# Patient Record
Sex: Male | Born: 1970 | Race: White | Hispanic: No | Marital: Single | Smoking: Current every day smoker
Health system: Southern US, Community
[De-identification: ages and names within clinical notes are randomized; demographics above are authoritative.]

## PROBLEM LIST (undated history)

## (undated) HISTORY — PX: EYE SURGERY: SHX253

## (undated) HISTORY — PX: TONSILLECTOMY: SUR1361

## (undated) HISTORY — PX: APPENDECTOMY: SHX54

---

## 2017-10-20 ENCOUNTER — Encounter (HOSPITAL_COMMUNITY): Payer: Self-pay | Admitting: Radiology

## 2017-10-20 ENCOUNTER — Emergency Department (HOSPITAL_COMMUNITY): Payer: Self-pay

## 2017-10-20 ENCOUNTER — Inpatient Hospital Stay (HOSPITAL_COMMUNITY)
Admission: EM | Admit: 2017-10-20 | Discharge: 2017-10-28 | DRG: 492 | Disposition: A | Discharge status: Home / Self Care | Payer: Self-pay | Attending: Student | Admitting: Student

## 2017-10-20 DIAGNOSIS — R402362 Coma scale, best motor response, obeys commands, at arrival to emergency department: Secondary | ICD-10-CM | POA: Diagnosis present

## 2017-10-20 DIAGNOSIS — Z23 Encounter for immunization: Secondary | ICD-10-CM

## 2017-10-20 DIAGNOSIS — E871 Hypo-osmolality and hyponatremia: Secondary | ICD-10-CM | POA: Diagnosis present

## 2017-10-20 DIAGNOSIS — R402132 Coma scale, eyes open, to sound, at arrival to emergency department: Secondary | ICD-10-CM | POA: Diagnosis present

## 2017-10-20 DIAGNOSIS — S020XXA Fracture of vault of skull, initial encounter for closed fracture: Secondary | ICD-10-CM | POA: Diagnosis present

## 2017-10-20 DIAGNOSIS — S066X9A Traumatic subarachnoid hemorrhage with loss of consciousness of unspecified duration, initial encounter: Secondary | ICD-10-CM | POA: Diagnosis present

## 2017-10-20 DIAGNOSIS — Z880 Allergy status to penicillin: Secondary | ICD-10-CM

## 2017-10-20 DIAGNOSIS — S064X9A Epidural hemorrhage with loss of consciousness of unspecified duration, initial encounter: Secondary | ICD-10-CM | POA: Diagnosis present

## 2017-10-20 DIAGNOSIS — Y904 Blood alcohol level of 80-99 mg/100 ml: Secondary | ICD-10-CM | POA: Diagnosis present

## 2017-10-20 DIAGNOSIS — G4700 Insomnia, unspecified: Secondary | ICD-10-CM | POA: Diagnosis present

## 2017-10-20 DIAGNOSIS — Z419 Encounter for procedure for purposes other than remedying health state, unspecified: Secondary | ICD-10-CM

## 2017-10-20 DIAGNOSIS — T148XXA Other injury of unspecified body region, initial encounter: Secondary | ICD-10-CM

## 2017-10-20 DIAGNOSIS — S064XAA Epidural hemorrhage with loss of consciousness status unknown, initial encounter: Secondary | ICD-10-CM | POA: Diagnosis present

## 2017-10-20 DIAGNOSIS — R402242 Coma scale, best verbal response, confused conversation, at arrival to emergency department: Secondary | ICD-10-CM | POA: Diagnosis present

## 2017-10-20 DIAGNOSIS — F10129 Alcohol abuse with intoxication, unspecified: Secondary | ICD-10-CM | POA: Diagnosis present

## 2017-10-20 DIAGNOSIS — S0232XA Fracture of orbital floor, left side, initial encounter for closed fracture: Secondary | ICD-10-CM | POA: Diagnosis present

## 2017-10-20 DIAGNOSIS — Y9355 Activity, bike riding: Secondary | ICD-10-CM

## 2017-10-20 DIAGNOSIS — F19239 Other psychoactive substance dependence with withdrawal, unspecified: Secondary | ICD-10-CM | POA: Diagnosis not present

## 2017-10-20 DIAGNOSIS — F17200 Nicotine dependence, unspecified, uncomplicated: Secondary | ICD-10-CM | POA: Diagnosis present

## 2017-10-20 DIAGNOSIS — S82841A Displaced bimalleolar fracture of right lower leg, initial encounter for closed fracture: Principal | ICD-10-CM | POA: Diagnosis present

## 2017-10-20 LAB — I-STAT CHEM 8, ED
BUN: 13 mg/dL (ref 6–20)
CHLORIDE: 101 mmol/L (ref 101–111)
Calcium, Ion: 1.16 mmol/L (ref 1.15–1.40)
Creatinine, Ser: 1.3 mg/dL — ABNORMAL HIGH (ref 0.61–1.24)
Glucose, Bld: 112 mg/dL — ABNORMAL HIGH (ref 65–99)
HEMATOCRIT: 41 % (ref 39.0–52.0)
HEMOGLOBIN: 13.9 g/dL (ref 13.0–17.0)
POTASSIUM: 3.9 mmol/L (ref 3.5–5.1)
SODIUM: 141 mmol/L (ref 135–145)
TCO2: 28 mmol/L (ref 22–32)

## 2017-10-20 LAB — COMPREHENSIVE METABOLIC PANEL
ALK PHOS: 47 U/L (ref 38–126)
ALT: 21 U/L (ref 17–63)
ANION GAP: 8 (ref 5–15)
AST: 23 U/L (ref 15–41)
Albumin: 3.9 g/dL (ref 3.5–5.0)
BILIRUBIN TOTAL: 0.3 mg/dL (ref 0.3–1.2)
BUN: 13 mg/dL (ref 6–20)
CALCIUM: 9 mg/dL (ref 8.9–10.3)
CO2: 26 mmol/L (ref 22–32)
Chloride: 105 mmol/L (ref 101–111)
Creatinine, Ser: 1.15 mg/dL (ref 0.61–1.24)
GFR calc Af Amer: >60 mL/min (ref >60)
Glucose, Bld: 114 mg/dL — ABNORMAL HIGH (ref 65–99)
Potassium: 3.9 mmol/L (ref 3.5–5.1)
Sodium: 139 mmol/L (ref 135–145)
TOTAL PROTEIN: 6.6 g/dL (ref 6.5–8.1)

## 2017-10-20 LAB — CBC WITH DIFFERENTIAL/PLATELET
BASOS ABS: 0.1 10*3/uL (ref 0.0–0.1)
BASOS PCT: 1 %
EOS PCT: 1 %
Eosinophils Absolute: 0.1 10*3/uL (ref 0.0–0.7)
HCT: 41.9 % (ref 39.0–52.0)
Hemoglobin: 14.5 g/dL (ref 13.0–17.0)
Lymphocytes Relative: 21 %
Lymphs Abs: 2.5 10*3/uL (ref 0.7–4.0)
MCH: 32.3 pg (ref 26.0–34.0)
MCHC: 34.6 g/dL (ref 30.0–36.0)
MCV: 93.3 fL (ref 78.0–100.0)
MONO ABS: 0.8 10*3/uL (ref 0.1–1.0)
Monocytes Relative: 7 %
Neutro Abs: 8.5 10*3/uL — ABNORMAL HIGH (ref 1.7–7.7)
Neutrophils Relative %: 70 %
PLATELETS: 204 10*3/uL (ref 150–400)
RBC: 4.49 MIL/uL (ref 4.22–5.81)
RDW: 12.6 % (ref 11.5–15.5)
WBC: 12 10*3/uL — AB (ref 4.0–10.5)

## 2017-10-20 LAB — ETHANOL: Alcohol, Ethyl (B): 84 mg/dL — ABNORMAL HIGH (ref <10)

## 2017-10-20 MED ORDER — ONDANSETRON HCL 4 MG/2ML IJ SOLN
4.0000 mg | Freq: Four times a day (QID) | INTRAMUSCULAR | Status: DC | PRN
  Administered 2017-10-22 – 2017-10-26 (×4): 4 mg via INTRAVENOUS
  Filled 2017-10-20 (×5): qty 2

## 2017-10-20 MED ORDER — KCL IN DEXTROSE-NACL 20-5-0.45 MEQ/L-%-% IV SOLN
INTRAVENOUS | Status: DC
  Administered 2017-10-20 – 2017-10-24 (×5): via INTRAVENOUS
  Filled 2017-10-20 (×7): qty 1000

## 2017-10-20 MED ORDER — ONDANSETRON 4 MG PO TBDP
4.0000 mg | ORAL_TABLET | Freq: Four times a day (QID) | ORAL | Status: DC | PRN
  Administered 2017-10-25: 4 mg via ORAL
  Filled 2017-10-20: qty 1

## 2017-10-20 MED ORDER — HYDROCODONE-ACETAMINOPHEN 5-325 MG PO TABS
1.0000 | ORAL_TABLET | ORAL | Status: DC | PRN
  Administered 2017-10-21 – 2017-10-25 (×3): 1 via ORAL
  Filled 2017-10-20 (×3): qty 1

## 2017-10-20 MED ORDER — MORPHINE SULFATE (PF) 2 MG/ML IV SOLN
1.0000 mg | INTRAVENOUS | Status: DC | PRN
  Administered 2017-10-20: 3 mg via INTRAVENOUS
  Filled 2017-10-20: qty 2

## 2017-10-20 MED ORDER — IOPAMIDOL (ISOVUE-300) INJECTION 61%
INTRAVENOUS | Status: AC
  Administered 2017-10-20: 100 mL
  Filled 2017-10-20: qty 100

## 2017-10-20 MED ORDER — PANTOPRAZOLE SODIUM 40 MG PO TBEC
40.0000 mg | DELAYED_RELEASE_TABLET | Freq: Every day | ORAL | Status: DC
  Administered 2017-10-21 – 2017-10-28 (×8): 40 mg via ORAL
  Filled 2017-10-20 (×8): qty 1

## 2017-10-20 MED ORDER — PANTOPRAZOLE SODIUM 40 MG IV SOLR: 40.0000 mg | Freq: Every day | INTRAVENOUS | Status: DC
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
## 2017-10-20 NOTE — ED Notes (Signed)
Cash, wallet and change are logged in with security at [facility] No phone was found on patient or in his belongings Clonazepam was counted and given to pharmacy at [facility]
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
## 2017-10-21 ENCOUNTER — Inpatient Hospital Stay (HOSPITAL_COMMUNITY): Payer: Self-pay

## 2017-10-21 LAB — BASIC METABOLIC PANEL
ANION GAP: 10 (ref 5–15)
BUN: 9 mg/dL (ref 6–20)
CALCIUM: 8.8 mg/dL — AB (ref 8.9–10.3)
CHLORIDE: 102 mmol/L (ref 101–111)
CO2: 25 mmol/L (ref 22–32)
Creatinine, Ser: 0.83 mg/dL (ref 0.61–1.24)
GFR calc non Af Amer: >60 mL/min (ref >60)
GLUCOSE: 129 mg/dL — AB (ref 65–99)
POTASSIUM: 3.4 mmol/L — AB (ref 3.5–5.1)
Sodium: 137 mmol/L (ref 135–145)

## 2017-10-21 LAB — HIV ANTIBODY (ROUTINE TESTING W REFLEX): HIV Screen 4th Generation wRfx: NONREACTIVE

## 2017-10-21 LAB — URINALYSIS, ROUTINE W REFLEX MICROSCOPIC
BILIRUBIN URINE: NEGATIVE
GLUCOSE, UA: NEGATIVE mg/dL
Hgb urine dipstick: NEGATIVE
KETONES UR: NEGATIVE mg/dL
LEUKOCYTES UA: NEGATIVE
NITRITE: NEGATIVE
PROTEIN: NEGATIVE mg/dL
Specific Gravity, Urine: >1.046 — ABNORMAL HIGH (ref 1.005–1.030)
pH: 6 (ref 5.0–8.0)

## 2017-10-21 LAB — GLUCOSE, CAPILLARY: GLUCOSE-CAPILLARY: 117 mg/dL — AB (ref 65–99)

## 2017-10-21 LAB — CBC
HEMATOCRIT: 40.7 % (ref 39.0–52.0)
HEMOGLOBIN: 13.7 g/dL (ref 13.0–17.0)
MCH: 31.8 pg (ref 26.0–34.0)
MCHC: 33.7 g/dL (ref 30.0–36.0)
MCV: 94.4 fL (ref 78.0–100.0)
Platelets: 168 10*3/uL (ref 150–400)
RBC: 4.31 MIL/uL (ref 4.22–5.81)
RDW: 13.1 % (ref 11.5–15.5)
WBC: 10.9 10*3/uL — AB (ref 4.0–10.5)

## 2017-10-21 LAB — RAPID URINE DRUG SCREEN, HOSP PERFORMED
Amphetamines: POSITIVE — AB
BENZODIAZEPINES: POSITIVE — AB
Barbiturates: NOT DETECTED
Cocaine: NOT DETECTED
Opiates: POSITIVE — AB
Tetrahydrocannabinol: POSITIVE — AB

## 2017-10-21 MED ORDER — MORPHINE SULFATE (PF) 4 MG/ML IV SOLN
1.0000 mg | INTRAVENOUS | Status: DC | PRN
  Administered 2017-10-22 – 2017-10-24 (×6): 3 mg via INTRAVENOUS
  Filled 2017-10-21 (×6): qty 1

## 2017-10-21 MED ORDER — TAMSULOSIN HCL 0.4 MG PO CAPS
0.4000 mg | ORAL_CAPSULE | Freq: Every day | ORAL | Status: DC
  Administered 2017-10-21 – 2017-10-28 (×8): 0.4 mg via ORAL
  Filled 2017-10-21 (×8): qty 1

## 2017-10-21 MED ORDER — DEXMEDETOMIDINE HCL IN NACL 200 MCG/50ML IV SOLN
0.2000 ug/kg/h | INTRAVENOUS | Status: DC
  Administered 2017-10-21 (×2): 0.3 ug/kg/h via INTRAVENOUS
  Administered 2017-10-22 (×2): 0.5 ug/kg/h via INTRAVENOUS
  Administered 2017-10-22: 0.3 ug/kg/h via INTRAVENOUS
  Administered 2017-10-23 (×2): 0.4 ug/kg/h via INTRAVENOUS
  Administered 2017-10-23: 0.5 ug/kg/h via INTRAVENOUS
  Administered 2017-10-24 (×2): 0.6 ug/kg/h via INTRAVENOUS
  Administered 2017-10-24: 0.5 ug/kg/h via INTRAVENOUS
  Administered 2017-10-24 – 2017-10-25 (×3): 0.3 ug/kg/h via INTRAVENOUS
  Filled 2017-10-21 (×14): qty 50

## 2017-10-21 MED ORDER — LORAZEPAM 2 MG/ML IJ SOLN
1.0000 mg | INTRAMUSCULAR | Status: DC | PRN
  Administered 2017-10-21 – 2017-10-24 (×6): 2 mg via INTRAVENOUS
  Filled 2017-10-21 (×7): qty 1
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
## 2017-10-21 NOTE — Progress Notes (Signed)
Patient's legal wife (separated for over a year) came in but did not want to see the patient. She is going to the police station to file a 50B (restraining order) and does not want any further communication about the patient. She gave us a number for the patient's first wife (three kids shared with pt), and mother. Neither of which have any communication with the patient because of his drug and alcohol use. Jiya Kissinger, [place] ScrapeSarah E, RN

## 2017-10-21 NOTE — ED Notes (Signed)
Pt denies SI at this time. Reports no thoughts of hurting himself.

## 2017-10-21 NOTE — Progress Notes (Signed)
Patient's "ex wife" contact information: Name: Gary ButteSeason [name] [phone].

## 2017-10-21 NOTE — Progress Notes (Signed)
Phlebotomy called to follow up about patient labs not being completed. RN will continue to follow up and monitor

## 2017-10-21 NOTE — Progress Notes (Addendum)
Patient upset that wife will not talk to him. He is threatening to leave AMA. We advised him to stay. He says unless he can talk to his wife he will leave. MD notified and agreed to let him go if he refuses to stay. Patient is angry but in his room at this time. Gary [name], [place] ScrapeSarah E, RN   ICU CIWA protocol ordered.
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
## 2017-10-21 NOTE — ED Notes (Signed)
All of patients belongings went with [facility], including wallet, money, clothes and medications

## 2017-10-21 NOTE — ED Notes (Signed)
[facility]
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
## 2017-10-22 LAB — CBC WITH DIFFERENTIAL/PLATELET
BASOS PCT: 0 %
Basophils Absolute: 0 10*3/uL (ref 0.0–0.1)
EOS ABS: 0.2 10*3/uL (ref 0.0–0.7)
EOS PCT: 2 %
HCT: 40.1 % (ref 39.0–52.0)
Hemoglobin: 13.5 g/dL (ref 13.0–17.0)
LYMPHS ABS: 1.7 10*3/uL (ref 0.7–4.0)
Lymphocytes Relative: 19 %
MCH: 31.4 pg (ref 26.0–34.0)
MCHC: 33.7 g/dL (ref 30.0–36.0)
MCV: 93.3 fL (ref 78.0–100.0)
MONOS PCT: 8 %
Monocytes Absolute: 0.7 10*3/uL (ref 0.1–1.0)
Neutro Abs: 6.3 10*3/uL (ref 1.7–7.7)
Neutrophils Relative %: 71 %
PLATELETS: 165 10*3/uL (ref 150–400)
RBC: 4.3 MIL/uL (ref 4.22–5.81)
RDW: 13 % (ref 11.5–15.5)
WBC: 8.9 10*3/uL (ref 4.0–10.5)

## 2017-10-22 LAB — BASIC METABOLIC PANEL
Anion gap: 9 (ref 5–15)
BUN: 9 mg/dL (ref 6–20)
CALCIUM: 8.8 mg/dL — AB (ref 8.9–10.3)
CO2: 25 mmol/L (ref 22–32)
CREATININE: 0.85 mg/dL (ref 0.61–1.24)
Chloride: 102 mmol/L (ref 101–111)
GFR calc Af Amer: >60 mL/min (ref >60)
Glucose, Bld: 116 mg/dL — ABNORMAL HIGH (ref 65–99)
Potassium: 3.7 mmol/L (ref 3.5–5.1)
SODIUM: 136 mmol/L (ref 135–145)
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
## 2017-10-23 MED ORDER — CEFAZOLIN SODIUM-DEXTROSE 2-4 GM/100ML-% IV SOLN
2.0000 g | INTRAVENOUS | Status: AC
  Administered 2017-10-24: 2 g via INTRAVENOUS

## 2017-10-23 NOTE — Progress Notes (Signed)
No new issues or problems.  Patient still with some headache.  No other neurologic symptoms.  Awakens easily.  Follows commands readily.  Strength equal bilaterally.  Cranial nerve function intact.  Status post skull fracture with small venous epidural hemorrhage.  The patient may be mobilized ad lib.

## 2017-10-23 NOTE — Anesthesia Preprocedure Evaluation (Addendum)
Anesthesia Evaluation  Patient identified by MRN, date of birth, ID band Patient awake    Reviewed: Allergy & Precautions, NPO status , Patient's Chart, lab work & pertinent test results  Airway Mallampati: II  TM Distance: >3 FB Neck ROM: Full    Dental  (+) Dental Advisory Given   Pulmonary neg pulmonary ROS,    breath sounds clear to auscultation       Cardiovascular negative cardio ROS   Rhythm:Regular Rate:Normal     Neuro/Psych Skull fx with small venous epidural hematoma and headache.    GI/Hepatic negative GI ROS, Neg liver ROS,   Endo/Other  negative endocrine ROS  Renal/GU negative Renal ROS     Musculoskeletal   Abdominal   Peds  Hematology negative hematology ROS (+)   Anesthesia Other Findings   Reproductive/Obstetrics                            Lab Results  Component Value Date   WBC 8.9 10/22/2017   HGB 13.5 10/22/2017   HCT 40.1 10/22/2017   MCV 93.3 10/22/2017   PLT 165 10/22/2017   Lab Results  Component Value Date   CREATININE 0.85 10/22/2017   BUN 9 10/22/2017   NA 136 10/22/2017   K 3.7 10/22/2017   CL 102 10/22/2017   CO2 25 10/22/2017    Anesthesia Physical Anesthesia Plan  ASA: II  Anesthesia Plan: General   Post-op Pain Management:  Regional for Post-op pain   Induction: Intravenous  PONV Risk Score and Plan: 2 and Ondansetron, Dexamethasone and Treatment may vary due to age or medical condition  Airway Management Planned: LMA  Additional Equipment:   Intra-op Plan:   Post-operative Plan: Extubation in OR  Informed Consent: I have reviewed the patients History and Physical, chart, labs and discussed the procedure including the risks, benefits and alternatives for the proposed anesthesia with the patient or authorized representative who has indicated his/her understanding and acceptance.   Dental advisory given  Plan Discussed with:  CRNA  Anesthesia Plan Comments:        Anesthesia Quick Evaluation

## 2017-10-23 NOTE — Progress Notes (Signed)
   Subjective/Chief Complaint: No events over night   Objective: Vital signs in last 24 hours: Temp:  [97.7 F (36.5 C)-98.2 F (36.8 C)] 97.7 F (36.5 C) (02/03 0400) Pulse Rate:  [72-99] 72 (02/03 0700) Resp:  [11-18] 13 (02/03 0700) BP: (84-114)/(54-82) 101/74 (02/03 0700) SpO2:  [99 %-100 %] 100 % (02/03 0700) Last BM Date: (pta)  Intake/Output from previous day: 02/02 0701 - 02/03 0700 In: 1980.7 [I.V.:1980.7] Out: 950 [Urine:950] Intake/Output this shift: No intake/output data recorded.  Exam: Sleeping but able to wake, answer questions, follow commands Lungs clear CV RRR Abdomen soft, NT  Lab Results:  Recent Labs    10/21/17 1346 10/22/17 0344  WBC 10.9* 8.9  HGB 13.7 13.5  HCT 40.7 40.1  PLT 168 165   BMET Recent Labs    10/21/17 1346 10/22/17 0344  NA 137 136  K 3.4* 3.7  CL 102 102  CO2 25 25  GLUCOSE 129* 116*  BUN 9 9  CREATININE 0.83 0.85  CALCIUM 8.8* 8.8*   PT/INR No results for input(s): LABPROT, INR in the last 72 hours. ABG No results for input(s): PHART, HCO3 in the last 72 hours.  Invalid input(s): PCO2, PO2  Studies/Results: Ct Head Wo Contrast  Result Date: 10/21/2017 CLINICAL DATA:  Continued surveillance head trauma. EXAM: CT HEAD WITHOUT CONTRAST TECHNIQUE: Contiguous axial images were obtained from the base of the skull through the vertex without intravenous contrast. COMPARISON:  10/20/2017. FINDINGS: Brain: BILATERAL frontal and parenchymal hemorrhages have worsened since yesterday's exam. These are most evident on coronal and sagittal reformats. There are areas of both new parenchymal hemorrhage is well as areas which were previously present are increased in size, largest of which is in the RIGHT frontal region. Mild edema surrounds the parenchymal hemorrhages. See coronal image 19 series 5. LEFT frontal parasagittal vertex epidural hematoma is slightly decreased in size. The skull fracture crosses the midline, and the  epidural hematoma is likely venous in origin. Subarachnoid blood is also slowly resolving. Vascular: No hyperdense vessel or unexpected calcification. Skull: Unchanged skull fracture. Sinuses/Orbits: No acute finding. Other: None. IMPRESSION: BILATERAL frontal and parenchymal hemorrhages have worsened since yesterday's exam. LEFT frontal parasagittal epidural hematoma is slightly decreased in size. Skull fracture unchanged. Resolving SAH. [name] call has been placed to the ordering provider. Electronically Signed   By: Elsie StainJohn T Curnes M.D.   On: 10/21/2017 13:05    Anti-infectives: Anti-infectives (From admission, onward)   None      Assessment/Plan: s/p Procedure(s): OPEN REDUCTION INTERNAL FIXATION (ORIF) ANKLE FRACTURE (Right)  Closed head injury.  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

## 2017-10-24 ENCOUNTER — Inpatient Hospital Stay (HOSPITAL_COMMUNITY): Payer: Self-pay

## 2017-10-24 ENCOUNTER — Encounter (HOSPITAL_COMMUNITY): Admission: EM | Disposition: A | Discharge status: Home / Self Care | Payer: Self-pay | Source: Home / Self Care

## 2017-10-24 ENCOUNTER — Inpatient Hospital Stay (HOSPITAL_COMMUNITY): Payer: Self-pay | Admitting: Anesthesiology

## 2017-10-24 HISTORY — PX: ORIF ANKLE FRACTURE: SHX5408

## 2017-10-24 LAB — SURGICAL PCR SCREEN
MRSA, PCR: NEGATIVE
STAPHYLOCOCCUS AUREUS: NEGATIVE

## 2017-10-24 SURGERY — OPEN REDUCTION INTERNAL FIXATION (ORIF) ANKLE FRACTURE
Anesthesia: General | Laterality: Right

## 2017-10-24 MED ORDER — DEXAMETHASONE SODIUM PHOSPHATE 10 MG/ML IJ SOLN
INTRAMUSCULAR | Status: AC
  Filled 2017-10-24: qty 1

## 2017-10-24 MED ORDER — CEFAZOLIN SODIUM-DEXTROSE 2-4 GM/100ML-% IV SOLN
2.0000 g | Freq: Three times a day (TID) | INTRAVENOUS | Status: AC
  Administered 2017-10-24 – 2017-10-25 (×3): 2 g via INTRAVENOUS
  Filled 2017-10-24 (×3): qty 100

## 2017-10-24 MED ORDER — PROPOFOL 10 MG/ML IV BOLUS
INTRAVENOUS | Status: DC | PRN
  Administered 2017-10-24: 200 mg via INTRAVENOUS

## 2017-10-24 MED ORDER — DEXAMETHASONE SODIUM PHOSPHATE 10 MG/ML IJ SOLN
INTRAMUSCULAR | Status: DC | PRN
  Administered 2017-10-24: 10 mg via INTRAVENOUS

## 2017-10-24 MED ORDER — PROPOFOL 10 MG/ML IV BOLUS
INTRAVENOUS | Status: AC
  Filled 2017-10-24: qty 20

## 2017-10-24 MED ORDER — LACTATED RINGERS IV SOLN
INTRAVENOUS | Status: DC | PRN
  Administered 2017-10-24 (×2): via INTRAVENOUS

## 2017-10-24 MED ORDER — ONDANSETRON HCL 4 MG/2ML IJ SOLN
INTRAMUSCULAR | Status: AC
  Filled 2017-10-24: qty 2

## 2017-10-24 MED ORDER — LIDOCAINE HCL (CARDIAC) 20 MG/ML IV SOLN
INTRAVENOUS | Status: DC | PRN
  Administered 2017-10-24: 40 mg via INTRAVENOUS

## 2017-10-24 MED ORDER — 0.9 % SODIUM CHLORIDE (POUR BTL) OPTIME
TOPICAL | Status: DC | PRN
  Administered 2017-10-24: 1000 mL

## 2017-10-24 MED ORDER — FENTANYL CITRATE (PF) 250 MCG/5ML IJ SOLN
INTRAMUSCULAR | Status: AC
  Filled 2017-10-24: qty 5

## 2017-10-24 MED ORDER — VANCOMYCIN HCL 500 MG IV SOLR
INTRAVENOUS | Status: AC
  Filled 2017-10-24: qty 500

## 2017-10-24 MED ORDER — FENTANYL CITRATE (PF) 100 MCG/2ML IJ SOLN: 25.0000 ug | INTRAMUSCULAR | Status: DC | PRN

## 2017-10-24 MED ORDER — LIDOCAINE 2% (20 MG/ML) 5 ML SYRINGE
INTRAMUSCULAR | Status: AC
  Filled 2017-10-24: qty 5

## 2017-10-24 MED ORDER — ONDANSETRON HCL 4 MG/2ML IJ SOLN
INTRAMUSCULAR | Status: DC | PRN
  Administered 2017-10-24: 4 mg via INTRAVENOUS

## 2017-10-24 MED ORDER — BUPIVACAINE-EPINEPHRINE (PF) 0.5% -1:200000 IJ SOLN
INTRAMUSCULAR | Status: DC | PRN
  Administered 2017-10-24: 25 mL via PERINEURAL

## 2017-10-24 MED ORDER — ROCURONIUM BROMIDE 10 MG/ML (PF) SYRINGE
PREFILLED_SYRINGE | INTRAVENOUS | Status: AC
  Filled 2017-10-24: qty 5

## 2017-10-24 MED ORDER — BACITRACIN ZINC 500 UNIT/GM EX OINT
TOPICAL_OINTMENT | CUTANEOUS | Status: AC
  Filled 2017-10-24: qty 28.35

## 2017-10-24 MED ORDER — MIDAZOLAM HCL 2 MG/2ML IJ SOLN
INTRAMUSCULAR | Status: AC
  Filled 2017-10-24: qty 2

## 2017-10-24 MED ORDER — VANCOMYCIN HCL 1000 MG IV SOLR
INTRAVENOUS | Status: DC | PRN
  Administered 2017-10-24: 500 mg

## 2017-10-24 MED ORDER — PROMETHAZINE HCL 25 MG/ML IJ SOLN: 6.2500 mg | INTRAMUSCULAR | Status: DC | PRN

## 2017-10-24 MED ORDER — ROPIVACAINE HCL 5 MG/ML IJ SOLN
INTRAMUSCULAR | Status: DC | PRN
  Administered 2017-10-24: 20 mL via PERINEURAL

## 2017-10-24 SURGICAL SUPPLY — 55 items
BANDAGE ACE 4X5 VEL STRL LF (GAUZE/BANDAGES/DRESSINGS) ×3 IMPLANT
BANDAGE ACE 6X5 VEL STRL LF (GAUZE/BANDAGES/DRESSINGS) ×3 IMPLANT
BANDAGE ESMARK 6X9 LF (GAUZE/BANDAGES/DRESSINGS) ×1 IMPLANT
BIT DRILL 2.5 X LONG (BIT) ×1
BIT DRILL QC 3.5X110 (BIT) ×3 IMPLANT
BIT DRILL X LONG 2.5 (BIT) ×1 IMPLANT
BNDG COHESIVE 4X5 TAN STRL (GAUZE/BANDAGES/DRESSINGS) ×3 IMPLANT
BNDG ESMARK 6X9 LF (GAUZE/BANDAGES/DRESSINGS) ×3
BRUSH SCRUB SURG 4.25 DISP (MISCELLANEOUS) ×6 IMPLANT
CHLORAPREP W/TINT 26ML (MISCELLANEOUS) ×3 IMPLANT
COVER MAYO STAND STRL (DRAPES) ×3 IMPLANT
COVER SURGICAL LIGHT HANDLE (MISCELLANEOUS) ×3 IMPLANT
DRAPE C-ARM 42X72 X-RAY (DRAPES) ×3 IMPLANT
DRAPE C-ARMOR (DRAPES) ×3 IMPLANT
DRAPE HALF SHEET 40X57 (DRAPES) ×6 IMPLANT
DRAPE ORTHO SPLIT 77X108 STRL (DRAPES) ×4
DRAPE SURG ORHT 6 SPLT 77X108 (DRAPES) ×2 IMPLANT
DRAPE U-SHAPE 47X51 STRL (DRAPES) ×3 IMPLANT
DRILL BIT X LONG 2.5 (BIT) ×2
DRSG EMULSION OIL 3X3 NADH (GAUZE/BANDAGES/DRESSINGS) ×3 IMPLANT
ELECT REM PT RETURN 9FT ADLT (ELECTROSURGICAL) ×3
ELECTRODE REM PT RTRN 9FT ADLT (ELECTROSURGICAL) ×1 IMPLANT
GAUZE SPONGE 4X4 12PLY STRL (GAUZE/BANDAGES/DRESSINGS) ×3 IMPLANT
GLOVE BIO SURGEON STRL SZ7.5 (GLOVE) ×12 IMPLANT
GLOVE BIOGEL PI IND STRL 7.5 (GLOVE) ×1 IMPLANT
GLOVE BIOGEL PI INDICATOR 7.5 (GLOVE) ×2
GOWN STRL REUS W/ TWL LRG LVL3 (GOWN DISPOSABLE) ×2 IMPLANT
GOWN STRL REUS W/TWL LRG LVL3 (GOWN DISPOSABLE) ×4
KIT ROOM TURNOVER OR (KITS) ×3 IMPLANT
MANIFOLD NEPTUNE II (INSTRUMENTS) ×3 IMPLANT
NEEDLE HYPO 21X1.5 SAFETY (NEEDLE) IMPLANT
NS IRRIG 1000ML POUR BTL (IV SOLUTION) ×3 IMPLANT
PACK TOTAL JOINT (CUSTOM PROCEDURE TRAY) ×3 IMPLANT
PACK UNIVERSAL I (CUSTOM PROCEDURE TRAY) ×3 IMPLANT
PAD ARMBOARD 7.5X6 YLW CONV (MISCELLANEOUS) ×6 IMPLANT
PAD CAST 4YDX4 CTTN HI CHSV (CAST SUPPLIES) ×1 IMPLANT
PADDING CAST COTTON 4X4 STRL (CAST SUPPLIES) ×2
PADDING CAST COTTON 6X4 STRL (CAST SUPPLIES) ×3 IMPLANT
SCREW CORTEX 3.5 65MM (Screw) ×3 IMPLANT
SCREW CORTEX 3.5 70MM SELF TAP (Screw) ×3 IMPLANT
SCREW CORTEX SELFTAP 3.5X95 (Screw) ×3 IMPLANT
SPONGE LAP 18X18 X RAY DECT (DISPOSABLE) ×3 IMPLANT
STAPLER VISISTAT 35W (STAPLE) ×3 IMPLANT
SUCTION FRAZIER HANDLE 10FR (MISCELLANEOUS) ×2
SUCTION TUBE FRAZIER 10FR DISP (MISCELLANEOUS) ×1 IMPLANT
SUT ETHILON 3 0 PS 1 (SUTURE) ×6 IMPLANT
SUT PROLENE 0 CT (SUTURE) IMPLANT
SUT VIC AB 2-0 CT1 27 (SUTURE) ×4
SUT VIC AB 2-0 CT1 TAPERPNT 27 (SUTURE) ×2 IMPLANT
TOWEL OR 17X24 6PK STRL BLUE (TOWEL DISPOSABLE) ×3 IMPLANT
TOWEL OR 17X26 10 PK STRL BLUE (TOWEL DISPOSABLE) ×6 IMPLANT
TUBE CONNECTING 12'X1/4 (SUCTIONS) ×1
TUBE CONNECTING 12X1/4 (SUCTIONS) ×2 IMPLANT
UNDERPAD 30X30 (UNDERPADS AND DIAPERS) ×3 IMPLANT
WATER STERILE IRR 1000ML POUR (IV SOLUTION) ×3 IMPLANT
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
## 2017-10-24 NOTE — Transfer of Care (Signed)
Immediate Anesthesia Transfer of Care Note  Patient: Gary AllegraChristopher [name]  Procedure(s) Performed: OPEN REDUCTION INTERNAL FIXATION (ORIF) ANKLE FRACTURE (Right )  Patient Location: PACU  Anesthesia Type:GA combined with regional for post-op pain  Level of Consciousness: awake and patient cooperative  Airway & Oxygen Therapy: Patient Spontanous Breathing  Post-op Assessment: Report given to RN and Post -op Vital signs reviewed and stable  Post vital signs: Reviewed and stable  Last Vitals:  Vitals:   10/24/17 0600 10/24/17 0839  BP: 108/71   Pulse: 71   Resp: 14   Temp:  (!) (P) 36.4 C  SpO2: 100%     Last Pain:  Vitals:   10/24/17 0400  TempSrc: Oral  PainSc: Asleep      Patients Stated Pain Goal: 5 (10/22/17 1116)  Complications: No apparent anesthesia complications
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
## 2017-10-24 NOTE — Anesthesia Postprocedure Evaluation (Signed)
Anesthesia Post Note  Patient: Gary AllegraChristopher Follmer  Procedure(s) Performed: OPEN REDUCTION INTERNAL FIXATION (ORIF) ANKLE FRACTURE (Right )     Patient location during evaluation: PACU Anesthesia Type: General Level of consciousness: awake and alert Pain management: pain level controlled Vital Signs Assessment: post-procedure vital signs reviewed and stable Respiratory status: spontaneous breathing, nonlabored ventilation, respiratory function stable and patient connected to nasal cannula oxygen Cardiovascular status: blood pressure returned to baseline and stable Postop Assessment: no apparent nausea or vomiting Anesthetic complications: no    Last Vitals:  Vitals:   10/24/17 0839 10/24/17 0853  BP: 110/71   Pulse: 75   Resp: 16   Temp: (!) 36.4 C 36.5 C  SpO2: 100%     Last Pain:  Vitals:   10/24/17 0400  TempSrc: Oral  PainSc: Asleep                 Kennieth RadFitzgerald, Gary [name]

## 2017-10-24 NOTE — Progress Notes (Signed)
Pt arrived to floor from PACU at  0909, no distress notes. Will continue to monitor. Dicie BeamFrazier, Huck Ashworth RN BSN.

## 2017-10-24 NOTE — Progress Notes (Signed)
SLP Cancellation Note  Patient Details Name: Rebeca AllegraChristopher Lyvers MRN: [id number] DOB: 05/19/1971   Cancelled treatment:       Reason Eval/Treat Not Completed: Other (comment). Received orders for cognitive evaluation. Patient s/p surgical intervention today. Will plan for evaluation 2/5 to avoid potential interaction of anesthesia, fatigue s/p surgery.   Ferdinand LangoLeah Tenna Lacko MA, CCC-SLP [phone]    Yomaris Palecek Meryl 10/24/2017, 2:56 PM

## 2017-10-24 NOTE — Anesthesia Procedure Notes (Signed)
Procedure Name: LMA Insertion Date/Time: 10/24/2017 7:30 AM Performed by: Rosiland OzMeyers, Carlen Rebuck, CRNA Pre-anesthesia Checklist: Emergency Drugs available, Suction available, Patient being monitored, Timeout performed and Patient identified Patient Re-evaluated:Patient Re-evaluated prior to induction Oxygen Delivery Method: Circle system utilized Preoxygenation: Pre-oxygenation with 100% oxygen Induction Type: IV induction LMA: LMA inserted LMA Size: 4.0 Number of attempts: 1 Placement Confirmation: positive ETCO2 and breath sounds checked- equal and bilateral Tube secured with: Tape Dental Injury: Teeth and Oropharynx as per pre-operative assessment

## 2017-10-24 NOTE — Progress Notes (Signed)
PT Cancellation Note  Patient Details Name: Gary AllegraChristopher Weipert MRN: [id number] DOB: 11/11/1970   Cancelled Treatment:    Reason Eval/Treat Not Completed: Medical issues which prohibited therapy(chart reviewed and pt currently POD#0. Will plan to evaluate next date)   [name] [name] 10/24/2017, 12:52 PM [name], PT [phone]

## 2017-10-24 NOTE — Anesthesia Procedure Notes (Signed)
Anesthesia Regional Block: Popliteal block   Pre-Anesthetic Checklist: ,, timeout performed, Correct Patient, Correct Site, Correct Laterality, Correct Procedure, Correct Position, site marked, Risks and benefits discussed,  Surgical consent,  Pre-op evaluation,  At surgeon's request and post-op pain management  Laterality: Right  Prep: chloraprep       Needles:  Injection technique: Single-shot  Needle Type: Echogenic Needle     Needle Length: 9cm  Needle Gauge: 21     Additional Needles:   Procedures:,,,, ultrasound used (permanent image in chart),,,,  Narrative:  Start time: 10/24/2017 6:55 AM End time: 10/24/2017 7:02 AM Injection made incrementally with aspirations every 5 mL.  Performed by: Personally  Anesthesiologist: Marcene DuosFitzgerald, Mischelle Reeg, MD

## 2017-10-24 NOTE — Interval H&P Note (Signed)
History and Physical Interval Note:  10/24/2017 7:12 AM  Gary [name]  has presented today for surgery, with the diagnosis of Right ankle fracture  The various methods of treatment have been discussed with the patient and family. After consideration of risks, benefits and other options for treatment, the patient has consented to  Procedure(s): OPEN REDUCTION INTERNAL FIXATION (ORIF) ANKLE FRACTURE (Right) as a surgical intervention .  The patient's history has been reviewed, patient examined, no change in status, stable for surgery.  I have reviewed the patient's chart and labs.  Questions were answered to the patient's satisfaction.     Selso Mannor, Gillie MannersKevin P
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
## 2017-10-24 NOTE — Anesthesia Procedure Notes (Signed)
Anesthesia Regional Block: Adductor canal block   Pre-Anesthetic Checklist: ,, timeout performed, Correct Patient, Correct Site, Correct Laterality, Correct Procedure, Correct Position, site marked, Risks and benefits discussed,  Surgical consent,  Pre-op evaluation,  At surgeon's request and post-op pain management  Laterality: Right  Prep: chloraprep       Needles:  Injection technique: Single-shot  Needle Type: Echogenic Needle     Needle Length: 9cm  Needle Gauge: 21     Additional Needles:   Procedures:,,,, ultrasound used (permanent image in chart),,,,  Narrative:  Start time: 10/24/2017 7:02 AM End time: 10/24/2017 7:07 AM Injection made incrementally with aspirations every 5 mL.  Performed by: Personally  Anesthesiologist: Marcene DuosFitzgerald, Brodyn Depuy, MD

## 2017-10-25 LAB — CBC
HCT: 39.7 % (ref 39.0–52.0)
Hemoglobin: 14.5 g/dL (ref 13.0–17.0)
MCH: 32.4 pg (ref 26.0–34.0)
MCHC: 36.5 g/dL — ABNORMAL HIGH (ref 30.0–36.0)
MCV: 88.8 fL (ref 78.0–100.0)
PLATELETS: 218 10*3/uL (ref 150–400)
RBC: 4.47 MIL/uL (ref 4.22–5.81)
RDW: 12.4 % (ref 11.5–15.5)
WBC: 11.5 10*3/uL — AB (ref 4.0–10.5)

## 2017-10-25 LAB — BASIC METABOLIC PANEL
Anion gap: 14 (ref 5–15)
BUN: 11 mg/dL (ref 6–20)
CALCIUM: 8.9 mg/dL (ref 8.9–10.3)
CO2: 22 mmol/L (ref 22–32)
Chloride: 91 mmol/L — ABNORMAL LOW (ref 101–111)
Creatinine, Ser: 0.69 mg/dL (ref 0.61–1.24)
GLUCOSE: 106 mg/dL — AB (ref 65–99)
POTASSIUM: 3.6 mmol/L (ref 3.5–5.1)
Sodium: 127 mmol/L — ABNORMAL LOW (ref 135–145)

## 2017-10-25 MED ORDER — ZOLPIDEM TARTRATE 5 MG PO TABS
5.0000 mg | ORAL_TABLET | Freq: Every day | ORAL | Status: DC
  Administered 2017-10-25 – 2017-10-27 (×3): 5 mg via ORAL
  Filled 2017-10-25 (×3): qty 1

## 2017-10-25 MED ORDER — TRAMADOL HCL 50 MG PO TABS
50.0000 mg | ORAL_TABLET | Freq: Four times a day (QID) | ORAL | Status: DC
  Administered 2017-10-25 – 2017-10-27 (×7): 50 mg via ORAL
  Filled 2017-10-25 (×7): qty 1
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
## 2017-10-25 NOTE — Plan of Care (Signed)
Care plan updated. Will continue to monitor. Dicie BeamFrazier, Tomeshia Pizzi RN BSN.
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
## 2017-10-26 ENCOUNTER — Encounter (HOSPITAL_COMMUNITY): Payer: Self-pay | Admitting: Student

## 2017-10-26 LAB — BASIC METABOLIC PANEL
ANION GAP: 13 (ref 5–15)
BUN: 10 mg/dL (ref 6–20)
CALCIUM: 9.1 mg/dL (ref 8.9–10.3)
CO2: 24 mmol/L (ref 22–32)
Chloride: 88 mmol/L — ABNORMAL LOW (ref 101–111)
Creatinine, Ser: 0.69 mg/dL (ref 0.61–1.24)
Glucose, Bld: 93 mg/dL (ref 65–99)
POTASSIUM: 3.7 mmol/L (ref 3.5–5.1)
Sodium: 125 mmol/L — ABNORMAL LOW (ref 135–145)

## 2017-10-26 MED ORDER — INFLUENZA VAC SPLIT QUAD 0.5 ML IM SUSY
0.5000 mL | PREFILLED_SYRINGE | INTRAMUSCULAR | Status: AC
  Administered 2017-10-28: 0.5 mL via INTRAMUSCULAR
  Filled 2017-10-26: qty 0.5

## 2017-10-26 MED ORDER — SODIUM CHLORIDE 0.9 % IV SOLN
INTRAVENOUS | Status: DC
  Administered 2017-10-26: 10:00:00 via INTRAVENOUS
  Administered 2017-10-27: 50 mL/h via INTRAVENOUS
  Administered 2017-10-28: 08:00:00 via INTRAVENOUS

## 2017-10-26 MED ORDER — MORPHINE SULFATE (PF) 4 MG/ML IV SOLN: 1.0000 mg | INTRAVENOUS | Status: DC | PRN
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
## 2017-10-26 NOTE — Progress Notes (Signed)
Trauma Service Note  Subjective: Patient is awake, oriented and has a poor appetite.  No distress  Objective: Vital signs in last 24 hours: Temp:  [97.4 F (36.3 C)-98.5 F (36.9 C)] 98.3 F (36.8 C) (02/06 0800) Pulse Rate:  [62-93] 74 (02/06 0800) Resp:  [9-26] 11 (02/06 0800) BP: (94-119)/(52-85) 97/60 (02/06 0800) SpO2:  [97 %-100 %] 99 % (02/06 0800) Last BM Date: 10/25/17  Intake/Output from previous day: 02/05 0701 - 02/06 0700 In: 303.4 [I.V.:303.4] Out: 950 [Urine:950] Intake/Output this shift: Total I/O In: 10 [I.V.:10] Out: -   General: No acute distress  Lungs: Clear  Abd: Benign  Extremities: Right LE in CAM boot, non-weight bearing.  Neuro: Intact  Lab Results: CBC  Recent Labs    10/25/17 0431  WBC 11.5*  HGB 14.5  HCT 39.7  PLT 218   BMET Recent Labs    10/25/17 0431 10/26/17 0441  NA 127* 125*  K 3.6 3.7  CL 91* 88*  CO2 22 24  GLUCOSE 106* 93  BUN 11 10  CREATININE 0.69 0.69  CALCIUM 8.9 9.1   PT/INR No results for input(s): LABPROT, INR in the last 72 hours. ABG No results for input(s): PHART, HCO3 in the last 72 hours.  Invalid input(s): PCO2, PO2  Studies/Results: Dg Ankle Right Port  Result Date: 10/24/2017 CLINICAL DATA:  Postop right ankle ORIF. EXAM: PORTABLE RIGHT ANKLE - 2 VIEW COMPARISON:  10/24/2017 and 10/20/2017. FINDINGS: Screws traverse fractures of the medial and lateral malleoli, with proper anatomic alignment. Ankle mortise is intact. Subcutaneous air is noted. IMPRESSION: Bimalleolar ORIF with normal anatomic alignment. Electronically Signed   By: Leanna BattlesMelinda  Blietz M.D.   On: 10/24/2017 11:12    Anti-infectives: Anti-infectives (From admission, onward)   Start     Dose/Rate Route Frequency Ordered Stop   10/24/17 1500  ceFAZolin (ANCEF) IVPB 2g/100 mL premix     2 g 200 mL/hr over 30 Minutes Intravenous Every 8 hours 10/24/17 0920 10/25/17 0634   10/24/17 0805  vancomycin (VANCOCIN) powder  Status:   Discontinued       As needed 10/24/17 0805 10/24/17 0835   10/24/17 0600  ceFAZolin (ANCEF) IVPB 2g/100 mL premix     2 g 200 mL/hr over 30 Minutes Intravenous To Short Stay 10/23/17 1942 10/24/17 0742      Assessment/Plan: s/p Procedure(s): OPEN REDUCTION INTERNAL FIXATION (ORIF) ANKLE FRACTURE Hyponatremia  Start saline drip at 50. Transfer to the floor. Repeat electrolytes tomorrow.  LOS: 6 days   Marta LamasJames O. Gae BonWyatt, III, MD, FACS [phone] Trauma Surgeon 10/26/2017
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
## 2017-10-27 ENCOUNTER — Encounter (HOSPITAL_COMMUNITY): Payer: Self-pay | Admitting: General Practice

## 2017-10-27 ENCOUNTER — Other Ambulatory Visit: Payer: Self-pay

## 2017-10-27 LAB — BASIC METABOLIC PANEL
Anion gap: 12 (ref 5–15)
BUN: 11 mg/dL (ref 6–20)
CALCIUM: 9.1 mg/dL (ref 8.9–10.3)
CO2: 23 mmol/L (ref 22–32)
Chloride: 89 mmol/L — ABNORMAL LOW (ref 101–111)
Creatinine, Ser: 0.68 mg/dL (ref 0.61–1.24)
GFR calc non Af Amer: >60 mL/min (ref >60)
GLUCOSE: 94 mg/dL (ref 65–99)
Potassium: 3.8 mmol/L (ref 3.5–5.1)
Sodium: 124 mmol/L — ABNORMAL LOW (ref 135–145)

## 2017-10-27 LAB — CBC
HEMATOCRIT: 42.5 % (ref 39.0–52.0)
HEMOGLOBIN: 15.5 g/dL (ref 13.0–17.0)
MCH: 32 pg (ref 26.0–34.0)
MCHC: 36.5 g/dL — AB (ref 30.0–36.0)
MCV: 87.6 fL (ref 78.0–100.0)
Platelets: 237 10*3/uL (ref 150–400)
RBC: 4.85 MIL/uL (ref 4.22–5.81)
RDW: 12.2 % (ref 11.5–15.5)
WBC: 9.6 10*3/uL (ref 4.0–10.5)

## 2017-10-27 MED ORDER — ENOXAPARIN SODIUM 40 MG/0.4ML ~~LOC~~ SOLN
40.0000 mg | SUBCUTANEOUS | Status: DC
  Administered 2017-10-27 – 2017-10-28 (×2): 40 mg via SUBCUTANEOUS
  Filled 2017-10-27 (×2): qty 0.4

## 2017-10-27 MED ORDER — HYDROCODONE-ACETAMINOPHEN 5-325 MG PO TABS
1.0000 | ORAL_TABLET | ORAL | Status: DC | PRN
  Filled 2017-10-27: qty 1

## 2017-10-27 MED ORDER — TRAMADOL HCL 50 MG PO TABS: 50.0000 mg | ORAL_TABLET | Freq: Four times a day (QID) | ORAL | Status: DC | PRN

## 2017-10-27 MED ORDER — SODIUM CHLORIDE 1 G PO TABS
1.0000 g | ORAL_TABLET | Freq: Two times a day (BID) | ORAL | Status: DC
  Administered 2017-10-27 – 2017-10-28 (×2): 1 g via ORAL
  Filled 2017-10-27 (×3): qty 1

## 2017-10-27 MED ORDER — MORPHINE SULFATE (PF) 2 MG/ML IV SOLN: 1.0000 mg | Freq: Four times a day (QID) | INTRAVENOUS | Status: DC | PRN

## 2017-10-27 MED ORDER — ACETAMINOPHEN 325 MG PO TABS
650.0000 mg | ORAL_TABLET | Freq: Four times a day (QID) | ORAL | Status: DC
  Administered 2017-10-27 – 2017-10-28 (×4): 650 mg via ORAL
  Filled 2017-10-27 (×4): qty 2
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
## 2017-10-27 NOTE — Discharge Instructions (Addendum)
Do not bear any weight on your right foot Keep wound covered while in boot Follow up with orthopedics in about 1 week for suture removal   Epidural Hemorrhage An epidural hemorrhage is a type of traumatic brain injury (TBI). It happens if bleeding occurs between the inside of the skull bone and the outer membrane that covers the brain (dura mater). As the amount of bleeding increases, the blood puts pressure on the brain. This can cause the affected part of the brain to stop working, and it may eventually cause death. An epidural hemorrhage is a medical emergency. What are the causes? This condition is caused by bleeding from a broken (ruptured) blood vessel. In most cases, a blood vessel ruptures and bleeds because of injury (trauma) to the head. Head trauma can happen in:  Traffic accidents.  Falls.  Assaults.  Sport injuries.  Less commonly, an epidural hemorrhage can happen without head trauma. In these cases, bleeding may be caused by:  An infection.  A tumor.  A bleeding disorder, such as hemophilia.  What increases the risk? This condition is more likely to develop in people who:  Participate in sports in which a head injury is possible. These include boxing, football, soccer, hockey, auto or bicycle racing, downhill skiing, and horseback riding.  Take risks while driving or riding in a motor vehicle, such as speeding.  Are at risk for falls.  Take blood thinners (anticoagulants), including aspirin or warfarin.  Have a bleeding disorder.  Abuse drugs or alcohol.  What are the signs or symptoms? Symptoms of this condition may include:  A severe headache or a headache that steadily gets worse.  Nausea or vomiting.  Dizziness.  Drowsiness.  A temporary decrease in alertness.  Loss of consciousness.  Confusion.  Changes in speech or difficulty speaking.  Seizures.  An inability to move an arm or a leg on one side of the body.  Symptoms may develop  immediately or several hours after a head injury. How is this diagnosed? This condition may be diagnosed based on your symptoms, your medical history, and a physical exam. You may have tests, including:  Blood tests.  A CT scan.  MRI.  How is this treated? This condition is a medical emergency that must be treated in a hospital immediately. Treatment depends on the cause, severity, and duration of your symptoms. The goals of treatment are to stop the bleeding, reduce pressure on the brain, and relieve symptoms. Treatment may include:  Medicines that: ? Lower blood pressure (antihypertensives). ? Relieve pain (analgesics). ? Relieve nausea and vomiting. ? Control seizures. ? Reduce swelling in the brain. ? Control bleeding.  Assisted breathing (ventilation). This involves using a machine called a ventilator to help you breathe.  Giving donated blood products through an IV tube (transfusion). You will receive cells that help your blood clot.  Placing a tube (shunt) in the brain to relieve pressure.  Performing surgery. This may be needed to stop bleeding, remove blood, or reduce pressure on the brain.  Follow these instructions at home: Activity  Return to your normal activities as told by your health care provider. Ask your health care provider what activities are safe for you.  If you play a contact sport and you experience a head injury, follow advice from your health care provider about when you can return to the sport.  Rest. Rest helps the brain to heal. Make sure you: ? Get plenty of sleep. Avoid staying up late at night. ?  Keep a consistent sleep schedule. Try to go to sleep and wake up at about the same time every day. ? Avoid activities that cause physical or mental stress. General instructions  Take over-the-counter and prescription medicines only as told by your health care provider.  Do not drive or operate heavy machinery until your health care provider  approves.  Limit alcohol intake to no more than 1 drink per day for nonpregnant women and 2 drinks per day for men. One drink equals 12 oz of beer, 5 oz of wine, or 1 oz of hard liquor.  Keep all follow-up visits as told by your health care provider. This is important. How is this prevented?  Wear protective gear, such as helmets, when participating in activities such as biking or contact sports.  Always wear a seat belt when you are in a motor vehicle.  Keep your home environment safe to reduce the risk of falling. Contact a health care provider if: You develop any of the following symptoms after your injury:  Headaches that keep coming back (chronic headaches).  Dizziness or balance problems.  Nausea.  Vision problems.  Increased sensitivity to noise or light.  Depression or mood swings.  Anxiety or irritability.  Memory problems.  Difficulty concentrating or paying attention.  Sleep problems.  Feeling tired all the time.  Recovery from brain injuries varies widely. Talk with your health care provider about what to expect during your recovery. Get help right away if:  You develop signs of an epidural hemorrhage, such as: ? A severe headache or a headache that steadily gets worse. ? Nausea or vomiting. ? Drowsiness. ? Dizziness. ? A temporary decrease in alertness. ? Loss of consciousness. ? Confusion. ? Changes in your speech or difficulty speaking. ? Seizure. ? Inability to move your arm or your leg on one side of your body.  You are taking blood thinners and you fall or you experience minor trauma to the head.  You have a bleeding disorder and you fall or you experience minor trauma to the head. These symptoms may represent a serious problem that is an emergency. Do not wait to see if the symptoms will go away. Get medical help right away. Call your local emergency services (911 in the [place] Do not drive yourself to the hospital. This information is not  intended to replace advice given to you by your health care provider. Make sure you discuss any questions you have with your health care provider. Document Released: 09/06/2005 Document Revised: 02/12/2016 Document Reviewed: 02/24/2015 Elsevier Interactive Patient Education  2018 [employer].  [facility] Discharge Instructions   General Discharge Instructions  WEIGHT BEARING STATUS: Non weight bearing  RANGE OF MOTION/ACTIVITY: Come out of the boot three times a day to move the ankle  Wound Care: You may shower but must keep the incisions covered while in the boot to prevent it from rubbing the suture line  DVT/PE prophylaxis: Per trauma time  Diet: as you were eating previously.  Can use over the counter stool softeners and bowel preparations, such as Miralax, to help with bowel movements.  Narcotics can be constipating.  Be sure to drink plenty of fluids  PAIN MEDICATION USE AND EXPECTATIONS  You have likely been given narcotic medications to help control your pain.  After a traumatic event that results in an fracture (broken bone) with or without surgery, it is ok to use narcotic pain medications to help control one's pain.  We understand that everyone  responds to pain differently and each individual patient will be evaluated on a regular basis for the continued need for narcotic medications. Ideally, narcotic medication use should last no more than 6-8 weeks (coinciding with fracture healing).   As a patient it is your responsibility as well to monitor narcotic medication use and report the amount and frequency you use these medications when you come to your office visit.   We would also advise that if you are using narcotic medications, you should take a dose prior to therapy to maximize you participation.  IF YOU ARE ON NARCOTIC MEDICATIONS IT IS NOT PERMISSIBLE TO OPERATE A MOTOR VEHICLE (MOTORCYCLE/CAR/TRUCK/MOPED) OR HEAVY MACHINERY DO NOT MIX NARCOTICS WITH OTHER  CNS (CENTRAL NERVOUS SYSTEM) DEPRESSANTS SUCH AS ALCOHOL   STOP SMOKING OR USING NICOTINE PRODUCTS!!!!  As discussed nicotine severely impairs your body's ability to heal surgical and traumatic wounds but also impairs bone healing.  Wounds and bone heal by forming microscopic blood vessels (angiogenesis) and nicotine is a vasoconstrictor (essentially, shrinks blood vessels).  Therefore, if vasoconstriction occurs to these microscopic blood vessels they essentially disappear and are unable to deliver necessary nutrients to the healing tissue.  This is one modifiable factor that you can do to dramatically increase your chances of healing your injury.    (This means no smoking, no nicotine gum, patches, etc)  DO NOT USE NONSTEROIDAL ANTI-INFLAMMATORY DRUGS (NSAID'S)  Using products such as Advil (ibuprofen), Aleve (naproxen), Motrin (ibuprofen) for additional pain control during fracture healing can delay and/or prevent the healing response.  If you would like to take over the counter (OTC) medication, Tylenol (acetaminophen) is ok.  However, some narcotic medications that are given for pain control contain acetaminophen as well. Therefore, you should not exceed more than 4000 mg of tylenol in a day if you do not have liver disease.  Also note that there are may OTC medicines, such as cold medicines and allergy medicines that my contain tylenol as well.  If you have any questions about medications and/or interactions please ask your doctor/PA or your pharmacist.      ICE AND ELEVATE INJURED/OPERATIVE EXTREMITY  Using ice and elevating the injured extremity above your heart can help with swelling and pain control.  Icing in a pulsatile fashion, such as 20 minutes on and 20 minutes off, can be followed.    Do not place ice directly on skin. Make sure there is a barrier between to skin and the ice pack.    Using frozen items such as frozen peas works well as the conform nicely to the are that needs to be  iced.  USE AN ACE WRAP OR TED HOSE FOR SWELLING CONTROL  In addition to icing and elevation, Ace wraps or TED hose are used to help limit and resolve swelling.  It is recommended to use Ace wraps or TED hose until you are informed to stop.    When using Ace Wraps start the wrapping distally (farthest away from the body) and wrap proximally (closer to the body)   Example: If you had surgery on your leg or thing and you do not have a splint on, start the ace wrap at the toes and work your way up to the thigh        If you had surgery on your upper extremity and do not have a splint on, start the ace wrap at your fingers and work your way up to the upper arm  IF YOU ARE IN  A CAM BOOT (BLACK BOOT)  You may remove boot periodically. Perform daily dressing changes as noted below.  Wash the liner of the boot regularly and wear a sock when wearing the boot. It is recommended that you sleep in the boot until told otherwise  CALL THE OFFICE WITH ANY QUESTIONS OR CONCERNS: [phone]
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
## 2017-10-28 LAB — BASIC METABOLIC PANEL
ANION GAP: 11 (ref 5–15)
BUN: 11 mg/dL (ref 6–20)
CHLORIDE: 93 mmol/L — AB (ref 101–111)
CO2: 23 mmol/L (ref 22–32)
Calcium: 8.9 mg/dL (ref 8.9–10.3)
Creatinine, Ser: 0.77 mg/dL (ref 0.61–1.24)
GFR calc non Af Amer: >60 mL/min (ref >60)
Glucose, Bld: 136 mg/dL — ABNORMAL HIGH (ref 65–99)
POTASSIUM: 3.9 mmol/L (ref 3.5–5.1)
SODIUM: 127 mmol/L — AB (ref 135–145)

## 2017-10-28 NOTE — Progress Notes (Signed)
Orthopedic Tech Progress Note Patient Details:  Gary [name] 02/21/1971 [id number]  Ortho Devices Type of Ortho Device: Crutches Ortho Device/Splint Location: provided crutches as ordered.  Pt ambulated very well. Ortho Device/Splint Interventions: Application, Adjustment   Post Interventions Patient Tolerated: Well, Ambulated well Instructions Provided: Adjustment of device, Care of device, Poper ambulation with device   Alvina ChouWilliams, Jossilyn Benda C 10/28/2017, 2:18 PM
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
## 2017-10-28 NOTE — Progress Notes (Signed)
Discharge teaching complete. Meds, diet, activity, follow up appointments and incision care reviewed and all questions answered. Copy of instructions given to patient.
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
## 2017-10-28 NOTE — Progress Notes (Signed)
Attempted to call ex-wife to explain patient being discharged, message left on answering machine for her to call me back.

## 2017-10-28 NOTE — Clinical Social Work Note (Signed)
Clinical Social Worker provided patient with [place]ary [place] [facility] [place]  Patient verbalized appreciation and understanding of flight details.  CSW provided RN with cab voucher for patient to transport at 15:00 to airport.  Clinical Social Worker will sign off for now as social work intervention is no longer needed. Please consult us again if new need arises.  Macario GoldsJesse Lempi [place], [place] [phone]

## 2017-11-03 ENCOUNTER — Encounter: Payer: Self-pay | Admitting: Student

## 2017-11-03 DIAGNOSIS — S82841A Displaced bimalleolar fracture of right lower leg, initial encounter for closed fracture: Secondary | ICD-10-CM | POA: Insufficient documentation
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
